# Patient Record
Sex: Male | Born: 1990 | Race: White | Hispanic: Yes | Marital: Married | Smoking: Never smoker
Health system: Southern US, Community
[De-identification: ages and names within clinical notes are randomized; demographics above are authoritative.]

---
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
## 2012-03-22 ENCOUNTER — Emergency Department (HOSPITAL_COMMUNITY): Payer: Self-pay

## 2012-03-22 ENCOUNTER — Encounter (HOSPITAL_COMMUNITY): Payer: Self-pay | Admitting: Emergency Medicine

## 2012-03-22 ENCOUNTER — Emergency Department (HOSPITAL_COMMUNITY)
Admission: EM | Admit: 2012-03-22 | Discharge: 2012-03-22 | Disposition: A | Discharge status: Home / Self Care | Payer: Self-pay | Attending: Emergency Medicine | Admitting: Emergency Medicine

## 2012-03-22 DIAGNOSIS — R0602 Shortness of breath: Secondary | ICD-10-CM | POA: Insufficient documentation

## 2012-03-22 DIAGNOSIS — R0789 Other chest pain: Secondary | ICD-10-CM

## 2012-03-22 DIAGNOSIS — R61 Generalized hyperhidrosis: Secondary | ICD-10-CM | POA: Insufficient documentation

## 2012-03-22 DIAGNOSIS — R6883 Chills (without fever): Secondary | ICD-10-CM | POA: Insufficient documentation

## 2012-03-22 DIAGNOSIS — R071 Chest pain on breathing: Secondary | ICD-10-CM | POA: Insufficient documentation

## 2012-03-22 DIAGNOSIS — R05 Cough: Secondary | ICD-10-CM | POA: Insufficient documentation

## 2012-03-22 DIAGNOSIS — R059 Cough, unspecified: Secondary | ICD-10-CM | POA: Insufficient documentation

## 2012-03-22 MED ORDER — IBUPROFEN 800 MG PO TABS
800.0000 mg | ORAL_TABLET | Freq: Once | ORAL | Status: AC
  Administered 2012-03-22: 800 mg via ORAL
  Filled 2012-03-22: qty 1

## 2012-03-22 MED ORDER — NAPROXEN 500 MG PO TABS: 500.0000 mg | ORAL_TABLET | Freq: Two times a day (BID) | ORAL | Status: DC

## 2012-03-22 NOTE — Discharge Instructions (Signed)
Take Naproxen twice a day. If you use the prescription, it is one pill twice a day. If you use the non-prescription medicine, it is two pills twice a day.  Costochondritis Costochondritis (Tietze syndrome), or costochondral separation, is a swelling and irritation (inflammation) of the tissue (cartilage) that connects your ribs with your breastbone (sternum). It may occur on its own (spontaneously), through damage caused by an accident (trauma), or simply from coughing or minor exercise. It may take up to 6 weeks to get better and longer if you are unable to be conservative in your activities. HOME CARE INSTRUCTIONS   Avoid exhausting physical activity. Try not to strain your ribs during normal activity. This would include any activities using chest, belly (abdominal), and side muscles, especially if heavy weights are used.  Use ice for 15 to 20 minutes per hour while awake for the first 2 days. Place the ice in a plastic bag, and place a towel between the bag of ice and your skin.  Only take over-the-counter or prescription medicines for pain, discomfort, or fever as directed by your caregiver. SEEK IMMEDIATE MEDICAL CARE IF:   Your pain increases or you are very uncomfortable.  You have a fever.  You develop difficulty with your breathing.  You cough up blood.  You develop worse chest pains, shortness of breath, sweating, or vomiting.  You develop new, unexplained problems (symptoms). MAKE SURE YOU:   Understand these instructions.  Will watch your condition.  Will get help right away if you are not doing well or get worse. Document Released: 01/11/2005 Document Revised: 06/26/2011 Document Reviewed: 11/20/2007 [facility] Patient Information 2013 [place], [place].  Naproxen and naproxen sodium oral immediate-release tablets What is this medicine? NAPROXEN (na PROX en) is a non-steroidal anti-inflammatory drug (NSAID). It is used to reduce swelling and to treat pain. This medicine  may be used for dental pain, headache, or painful monthly periods. It is also used for painful joint and muscular problems such as arthritis, tendinitis, bursitis, and gout. This medicine may be used for other purposes; ask your health care provider or pharmacist if you have questions. What should I tell my health care provider before I take this medicine? They need to know if you have any of these conditions: -asthma -cigarette smoker -drink more than 3 alcohol containing drinks a day -heart disease or circulation problems such as heart failure or leg edema (fluid retention) -high blood pressure -kidney disease -liver disease -stomach bleeding or ulcers -an unusual or allergic reaction to naproxen, aspirin, other NSAIDs, other medicines, foods, dyes, or preservatives -pregnant or trying to get pregnant -breast-feeding How should I use this medicine? Take this medicine by mouth with a glass of water. Follow the directions on the prescription label. Take it with food if your stomach gets upset. Try to not lie down for at least 10 minutes after you take it. Take your medicine at regular intervals. Do not take your medicine more often than directed. Long-term, continuous use may increase the risk of heart attack or stroke. A special MedGuide will be given to you by the pharmacist with each prescription and refill. Be sure to read this information carefully each time. Talk to your pediatrician regarding the use of this medicine in children. Special care may be needed. Overdosage: If you think you have taken too much of this medicine contact a poison control center or emergency room at once. NOTE: This medicine is only for you. Do not share this medicine with others. What  if I miss a dose? If you miss a dose, take it as soon as you can. If it is almost time for your next dose, take only that dose. Do not take double or extra doses. What may interact with this  medicine? -alcohol -aspirin -cidofovir -diuretics -lithium -methotrexate -other drugs for inflammation like ketorolac or prednisone -pemetrexed -probenecid -warfarin This list may not describe all possible interactions. Give your health care provider a list of all the medicines, herbs, non-prescription drugs, or dietary supplements you use. Also tell them if you smoke, drink alcohol, or use illegal drugs. Some items may interact with your medicine. What should I watch for while using this medicine? Tell your doctor or health care professional if your pain does not get better. Talk to your doctor before taking another medicine for pain. Do not treat yourself. This medicine does not prevent heart attack or stroke. In fact, this medicine may increase the chance of a heart attack or stroke. The chance may increase with longer use of this medicine and in people who have heart disease. If you take aspirin to prevent heart attack or stroke, talk with your doctor or health care professional. Do not take other medicines that contain aspirin, ibuprofen, or naproxen with this medicine. Side effects such as stomach upset, nausea, or ulcers may be more likely to occur. Many medicines available without a prescription should not be taken with this medicine. This medicine can cause ulcers and bleeding in the stomach and intestines at any time during treatment. Do not smoke cigarettes or drink alcohol. These increase irritation to your stomach and can make it more susceptible to damage from this medicine. Ulcers and bleeding can happen without warning symptoms and can cause death. You may get drowsy or dizzy. Do not drive, use machinery, or do anything that needs mental alertness until you know how this medicine affects you. Do not stand or sit up quickly, especially if you are an older patient. This reduces the risk of dizzy or fainting spells. This medicine can cause you to bleed more easily. Try to avoid damage  to your teeth and gums when you brush or floss your teeth. What side effects may I notice from receiving this medicine? Side effects that you should report to your doctor or health care professional as soon as possible: -black or bloody stools, blood in the urine or vomit -blurred vision -chest pain -difficulty breathing or wheezing -nausea or vomiting -severe stomach pain -skin rash, skin redness, blistering or peeling skin, hives, or itching -slurred speech or weakness on one side of the body -swelling of eyelids, throat, lips -unexplained weight gain or swelling -unusually weak or tired -yellowing of eyes or skin Side effects that usually do not require medical attention (report to your doctor or health care professional if they continue or are bothersome): -constipation -headache -heartburn This list may not describe all possible side effects. Call your doctor for medical advice about side effects. You may report side effects to FDA at 1-800-FDA-1088. Where should I keep my medicine? Keep out of the reach of children. Store at room temperature between 15 and 30 degrees C (59 and 86 degrees F). Keep container tightly closed. Throw away any unused medicine after the expiration date. NOTE: This sheet is a summary. It may not cover all possible information. If you have questions about this medicine, talk to your doctor, pharmacist, or health care provider.  2012, [employer]vier/Gold Standard. (04/05/2009 8:10:16 PM)

## 2012-03-22 NOTE — ED Notes (Signed)
Pt reports onset of chest pain 3 days ago. Started coughing last night. Pt denies nausea, vomiting or diaphoresis. Pt does state that he feels SOB. No family hx.

## 2012-03-22 NOTE — ED Provider Notes (Signed)
History  This chart was scribed for Dione Booze, MD by Shari Heritage, ED Scribe. The patient was seen in room APA15/APA15. Patient's care was started at 2147.  CSN: [id number]  Arrival date & time 03/22/12  2009   First MD Initiated Contact with Patient 03/22/12 2147      Chief Complaint  Patient presents with  . Chest Pain    The history is provided by the patient. No language interpreter was used.    HPI Comments: Jeremiah Hall is a 21 y.o. male who presents to the Emergency Department complaining of sharp, moderate, gradually worsening, bilateral, upper chest pain onset 3 days ago. Patient says that currently, pain is localized to the left side of his chest. Breathing and certain positions make the pain worse. There is associated SOB, nonproductive cough, chills and diaphoresis. Patient describes shortness of breath as feeling like he is not getting enough air. Patient says that he took some Tylenol for pain with mild relief.  Patient reports no significant past medical or surgical history. Patient does not smoke or drink. He does not have a PCP. He has a family history of diabetes.   Family History  Problem Relation Age of Onset  . Diabetes Father     History  Substance Use Topics  . Smoking status: Never Smoker   . Smokeless tobacco: Never Used  . Alcohol Use: No      Review of Systems  Constitutional: Positive for chills and diaphoresis.  Respiratory: Positive for cough and shortness of breath.   Cardiovascular: Positive for chest pain.  All other systems reviewed and are negative.    Allergies  Review of patient's allergies indicates no known allergies.  Home Medications  No current outpatient prescriptions on file.  Triage Vitals: BP 136/74  Pulse 70  Resp 16  Ht 6' (1.829 m)  Wt 255 lb (115.667 kg)  BMI 34.58 kg/m2  SpO2 99%  Physical Exam  Nursing note and vitals reviewed. Constitutional: He is oriented to person, place, and time. He appears  well-developed and well-nourished. No distress.  HENT:  Head: Normocephalic and atraumatic.  Eyes: Conjunctivae normal and EOM are normal. Pupils are equal, round, and reactive to light.  Neck: Neck supple.  Cardiovascular: Normal rate, regular rhythm and normal heart sounds.   No murmur heard. Pulmonary/Chest: Effort normal and breath sounds normal. No respiratory distress. He exhibits tenderness.       Mild anterior chest wall tenderness.  Abdominal: Soft. He exhibits no distension.  Musculoskeletal: Normal range of motion. He exhibits no edema.  Neurological: He is alert and oriented to person, place, and time.  Skin: Skin is warm and dry.  Psychiatric: He has a normal mood and affect. His behavior is normal. Thought content normal.    ED Course  Procedures (including critical care time) DIAGNOSTIC STUDIES: Oxygen Saturation is 99% on room air, normal by my interpretation.    COORDINATION OF CARE: 9:56 PM- Patient informed of current plan for treatment and evaluation and agrees with plan at this time. Medications ordered: 1 tablet ibuprofen 800 mg. Ordered chest x-ray.  Dg Chest 2 View  03/22/2012  *RADIOLOGY REPORT*  Clinical Data: Chest pain.  CHEST - 2 VIEW  Comparison: None.  Findings: Heart and mediastinal contours are within normal limits. No focal opacities or effusions.  No acute bony abnormality.  IMPRESSION: No active cardiopulmonary disease.   Original Report Authenticated By: Charlett Nose, M.D.    ECG shows normal sinus rhythm with a rate  of 66, no ectopy. Normal axis. Normal P wave. Normal QRS. Normal intervals. Normal ST and T waves. Impression: normal ECG. No prior ECG available for comparison.   1. Chest wall pain       MDM  Chest pain which seems to be chest wall pain. This may represent costochondritis. Chest x-ray will be obtained and will be given a therapeutic trial of ibuprofen.  He got good relief of pain with ibuprofen. Chest x-ray is unremarkable.  He'll be sent home with a prescription for naproxen.  I personally performed the services described in this documentation, which was scribed in my presence. The recorded information has been reviewed and is accurate.       Dione Booze, MD 03/22/12 [phone]

## 2013-12-19 IMAGING — CR DG CHEST 2V
2 series · 2 of 2 positions shown · non-contrast
Comparison: None.

CLINICAL DATA: Chest pain.

CHEST - 2 VIEW

[view not recorded (1 of 2)]
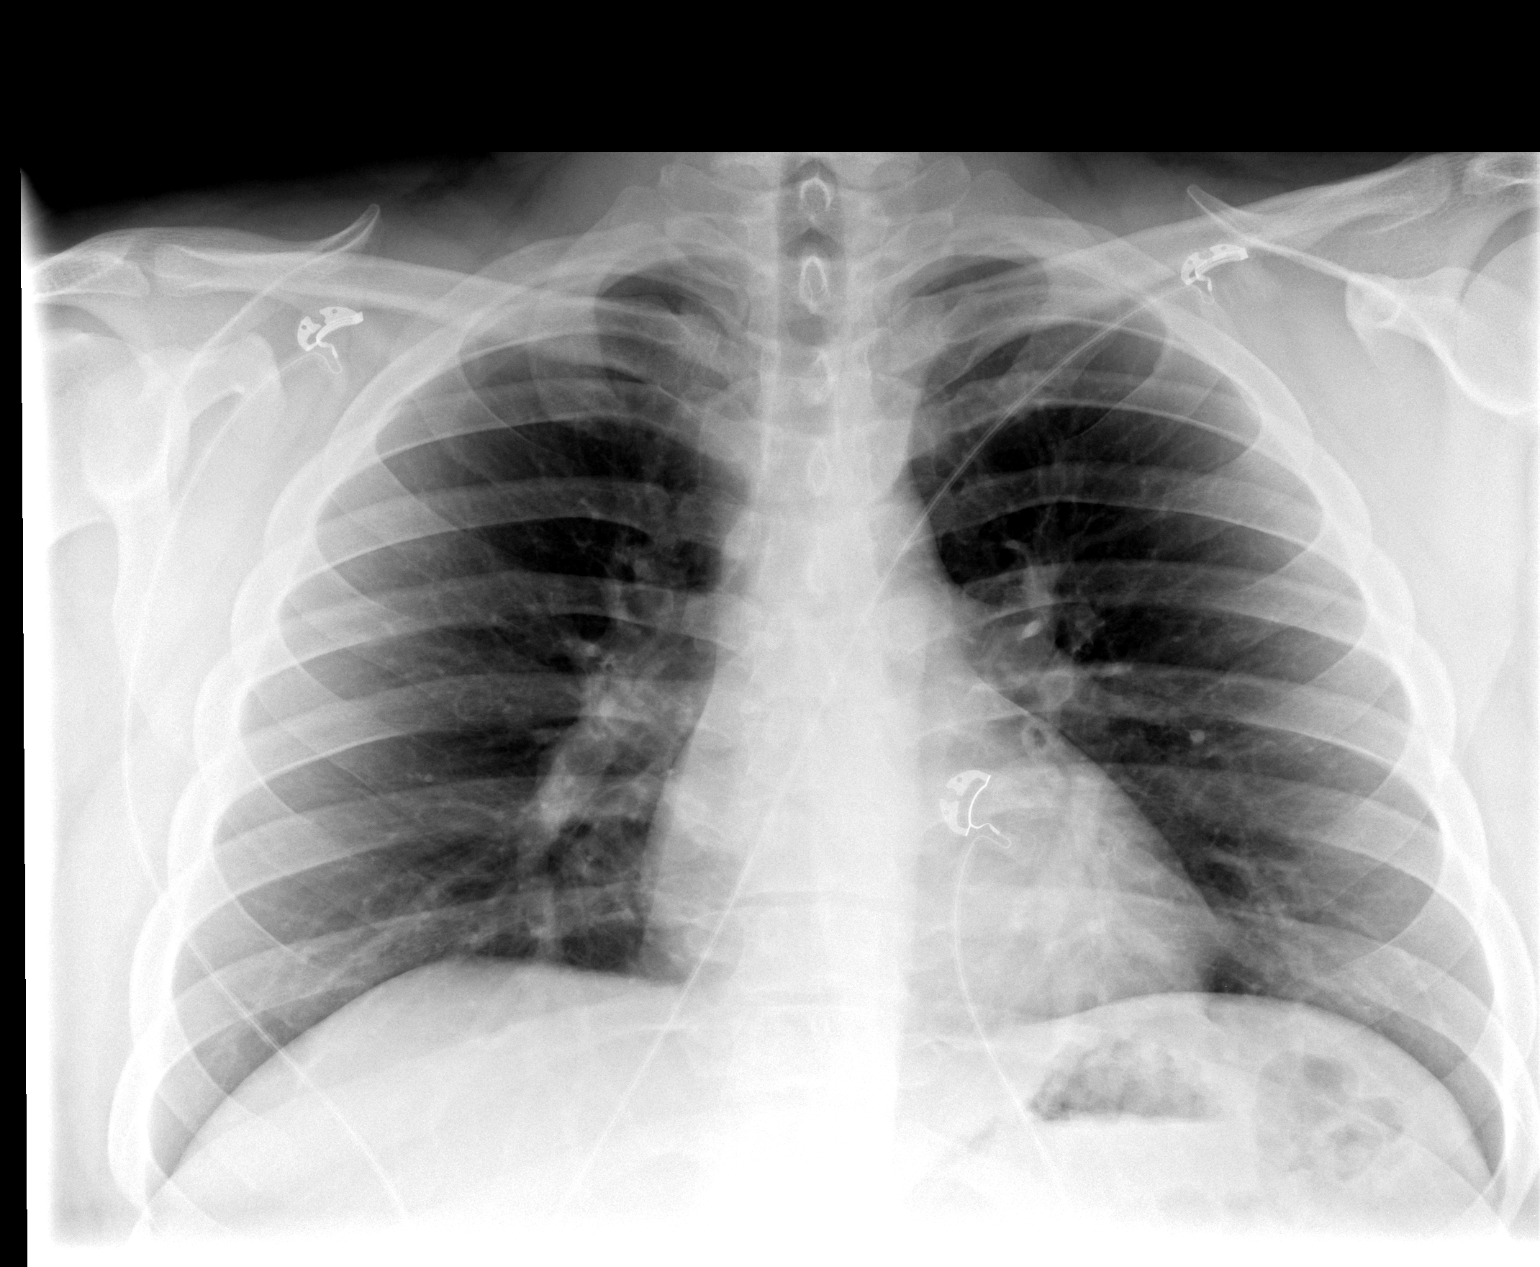

[view not recorded (2 of 2)]
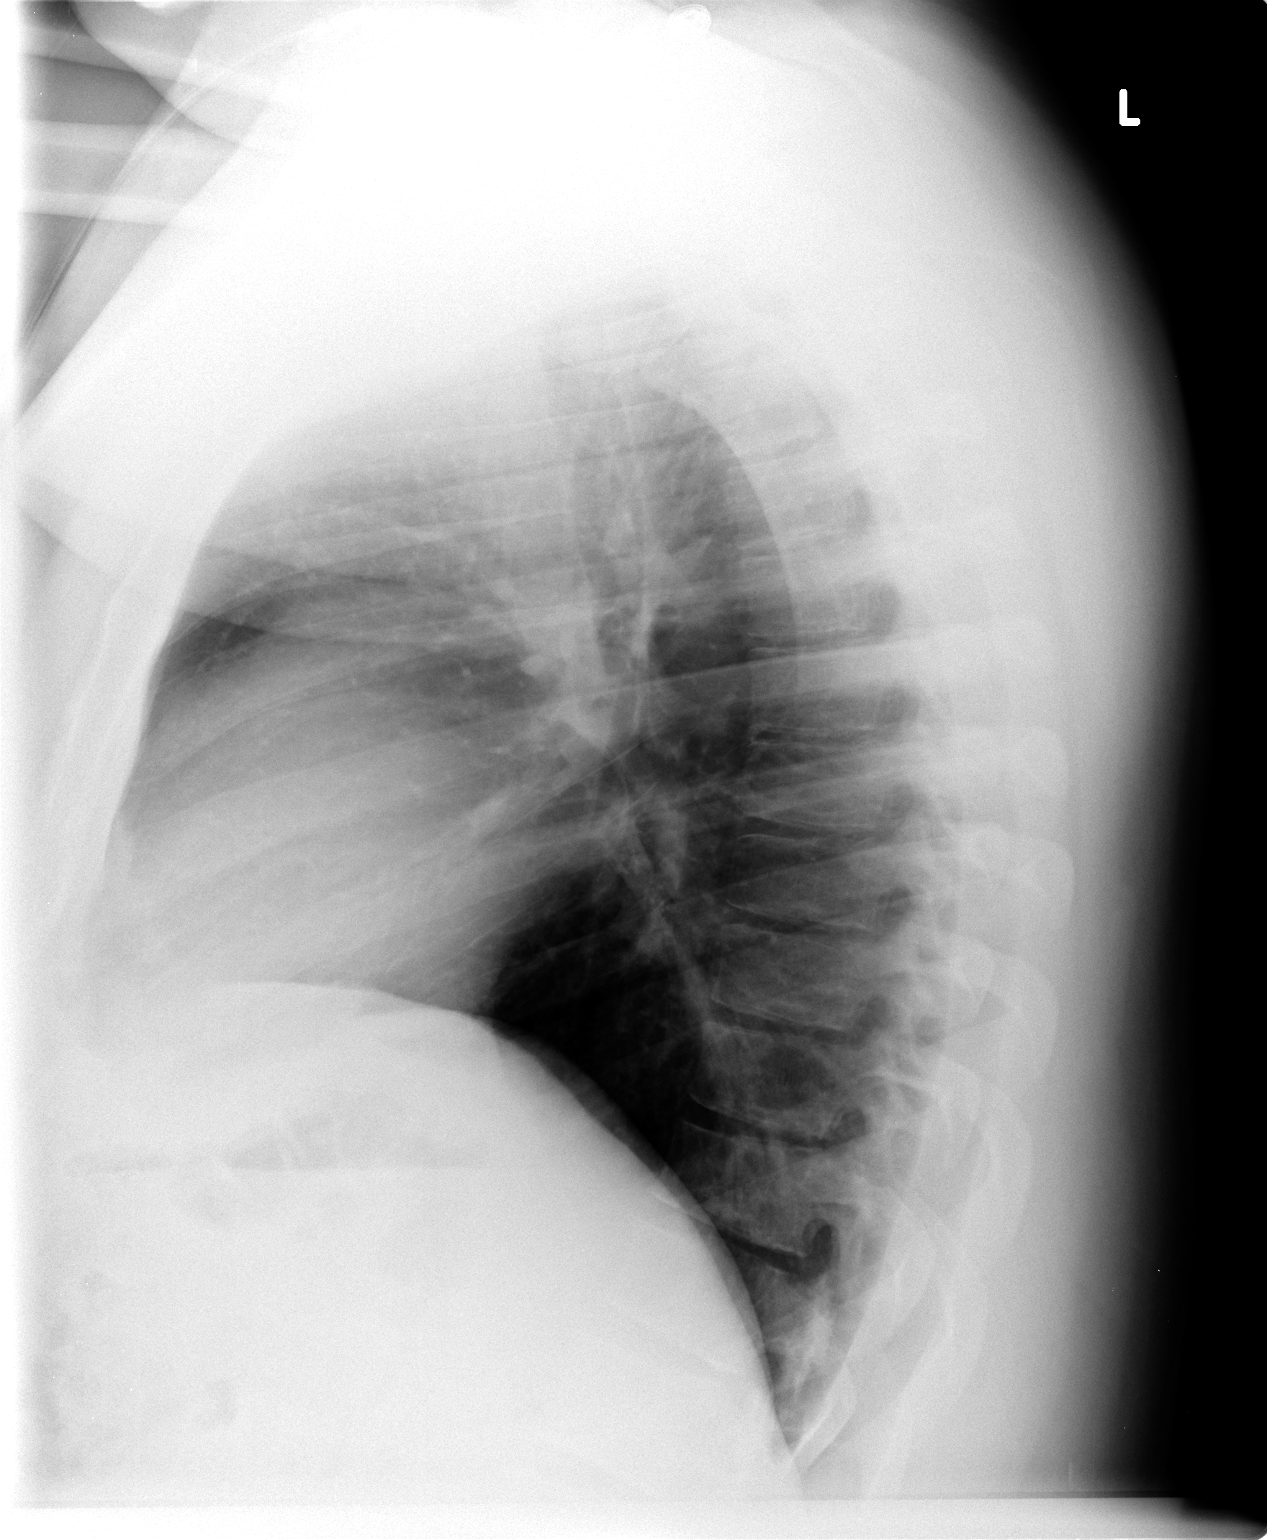

[2 of 2 positions shown; findings below may reference images not displayed]

FINDINGS: Heart and mediastinal contours are within normal limits.
No focal opacities or effusions.  No acute bony abnormality.
IMPRESSION: No active cardiopulmonary disease.

## 2017-03-24 ENCOUNTER — Encounter (HOSPITAL_COMMUNITY): Payer: Self-pay | Admitting: Emergency Medicine

## 2017-03-24 ENCOUNTER — Emergency Department (HOSPITAL_COMMUNITY): Payer: Self-pay

## 2017-03-24 ENCOUNTER — Emergency Department (HOSPITAL_COMMUNITY)
Admission: EM | Admit: 2017-03-24 | Discharge: 2017-03-24 | Disposition: A | Discharge status: Home / Self Care | Payer: Self-pay | Attending: Emergency Medicine | Admitting: Emergency Medicine

## 2017-03-24 DIAGNOSIS — R0789 Other chest pain: Secondary | ICD-10-CM | POA: Insufficient documentation

## 2017-03-24 LAB — URINALYSIS, ROUTINE W REFLEX MICROSCOPIC
Bilirubin Urine: NEGATIVE
Glucose, UA: NEGATIVE mg/dL
Hgb urine dipstick: NEGATIVE
Ketones, ur: NEGATIVE mg/dL
Leukocytes, UA: NEGATIVE
Nitrite: NEGATIVE
Protein, ur: NEGATIVE mg/dL
Specific Gravity, Urine: 1.023 (ref 1.005–1.030)
pH: 5 (ref 5.0–8.0)

## 2017-03-24 LAB — CBG MONITORING, ED: Glucose-Capillary: 89 mg/dL (ref 65–99)

## 2017-03-24 MED ORDER — PANTOPRAZOLE SODIUM 20 MG PO TBEC: 20.0000 mg | DELAYED_RELEASE_TABLET | Freq: Two times a day (BID) | ORAL | 0 refills | Status: AC

## 2017-03-24 MED ORDER — GI COCKTAIL ~~LOC~~
30.0000 mL | Freq: Once | ORAL | Status: AC
  Administered 2017-03-24: 30 mL via ORAL
  Filled 2017-03-24: qty 30

## 2017-03-24 MED ORDER — FAMOTIDINE 20 MG PO TABS
20.0000 mg | ORAL_TABLET | Freq: Once | ORAL | Status: AC
  Administered 2017-03-24: 20 mg via ORAL
  Filled 2017-03-24: qty 1

## 2017-03-24 NOTE — ED Triage Notes (Addendum)
Pt C/O of difficulty swallowing and states "it feels like something is stuck in my throat." Pt also C/O Sob and increased thirst. NAD. Pt states this has been going on since Tuesday.

## 2017-03-24 NOTE — ED Notes (Signed)
Gave EKG to Dr.Kohut 

## 2017-03-25 NOTE — ED Provider Notes (Signed)
[facility]ERGENCY DEPARTMENT Provider Note   CSN: [id number] Arrival date & time: 03/24/17  1927     History   Chief Complaint Chief Complaint  Patient presents with  . Dysphagia    HPI Jeremiah Hall is a 26 y.o. male.  HPI   26yM with chest tightness. Says not really pain. Feels like their is something there. Sometimes feels like things get stuck when he swallowed solid. No n/v. No respiratory complaints. No orthopnea. Occasional nonproductive cough. No unusual leg pain or swelling.   History reviewed. No pertinent past medical history.  There are no active problems to display for this patient.   History reviewed. No pertinent surgical history.     Home Medications    Prior to Admission medications   Medication Sig Start Date End Date Taking? Authorizing Provider  naproxen sodium (ALEVE) 220 MG tablet Take 220 mg by mouth daily as needed (for pain).   Yes [provider]  pantoprazole (PROTONIX) 20 MG tablet Take 1 tablet (20 mg total) by mouth 2 (two) times daily before a meal. 03/24/17   Raeford RazorKohut, Lavinia Mcneely, MD    Family History Family History  Problem Relation Age of Onset  . Diabetes Father     Social History Social History   Tobacco Use  . Smoking status: Never Smoker  . Smokeless tobacco: Never Used  Substance Use Topics  . Alcohol use: No  . Drug use: No     Allergies   Patient has no known allergies.   Review of Systems Review of Systems  All systems reviewed and negative, other than as noted in HPI.  Physical Exam Updated Vital Signs BP 116/78   Pulse (!) 56   Temp 98.4 F (36.9 C) (Oral)   Resp (!) 22   Ht 6' (1.829 m)   Wt 133.7 kg (294 lb 12.8 oz)   SpO2 98%   BMI 39.98 kg/m   Physical Exam  Constitutional: He appears well-developed and well-nourished. No distress.  Sitting in bed. NAD. Obese.   HENT:  Head: Normocephalic and atraumatic.  Eyes: Conjunctivae are normal. Right eye exhibits no discharge. Left  eye exhibits no discharge.  Neck: Neck supple.  Cardiovascular: Normal rate, regular rhythm and normal heart sounds. Exam reveals no gallop and no friction rub.  No murmur heard. Pulmonary/Chest: Effort normal and breath sounds normal. No respiratory distress.  Abdominal: Soft. He exhibits no distension. There is no tenderness.  Musculoskeletal: He exhibits no edema or tenderness.  Lower extremities symmetric as compared to each other. No calf tenderness. Negative Homan's. No palpable cords.   Neurological: He is alert.  Skin: Skin is warm and dry.  Psychiatric: He has a normal mood and affect. His behavior is normal. Thought content normal.  Nursing note and vitals reviewed.    ED Treatments / Results  Labs (all labs ordered are listed, but only abnormal results are displayed) Labs Reviewed  URINALYSIS, ROUTINE W REFLEX MICROSCOPIC  CBG MONITORING, ED    EKG  EKG Interpretation None       Radiology Dg Chest 2 View  Result Date: 03/24/2017 CLINICAL DATA:  Chest tightness.  Shortness of breath. EXAM: CHEST  2 VIEW COMPARISON:  March 22, 2012 FINDINGS: The heart size and mediastinal contours are within normal limits. Both lungs are clear. The visualized skeletal structures are unremarkable. IMPRESSION: No active cardiopulmonary disease. Electronically Signed   By: Gerome Samavid  Williams III M.D   On: 03/24/2017 22:38    Procedures Procedures (including  critical care time)  Medications Ordered in ED Medications  gi cocktail (Maalox,Lidocaine,Donnatal) (30 mLs Oral Given 03/24/17 2224)  famotidine (PEPCID) tablet 20 mg (20 mg Oral Given 03/24/17 2224)     Initial Impression / Assessment and Plan / ED Course  I have reviewed the triage vital signs and the nursing notes.  Pertinent labs & imaging results that were available during my care of the patient were reviewed by me and considered in my medical decision making (see chart for details).     26yM with chest tightness.  Suspect may be gerd. Doubt acs, pe, dissection or other emergent process. Trial of ppi.   Final Clinical Impressions(s) / ED Diagnoses   Final diagnoses:  Chest tightness    ED Discharge Orders        Ordered    pantoprazole (PROTONIX) 20 MG tablet  2 times daily before meals     03/24/17 2259       Raeford RazorKohut, Kindred Heying, MD 03/25/17 1920

## 2018-12-21 IMAGING — DX DG CHEST 2V
2 series · 2 of 2 positions shown · non-contrast
Comparison: March 22, 2012

CLINICAL DATA: Chest tightness.  Shortness of breath.

EXAM:
CHEST  2 VIEW

[chest pa]
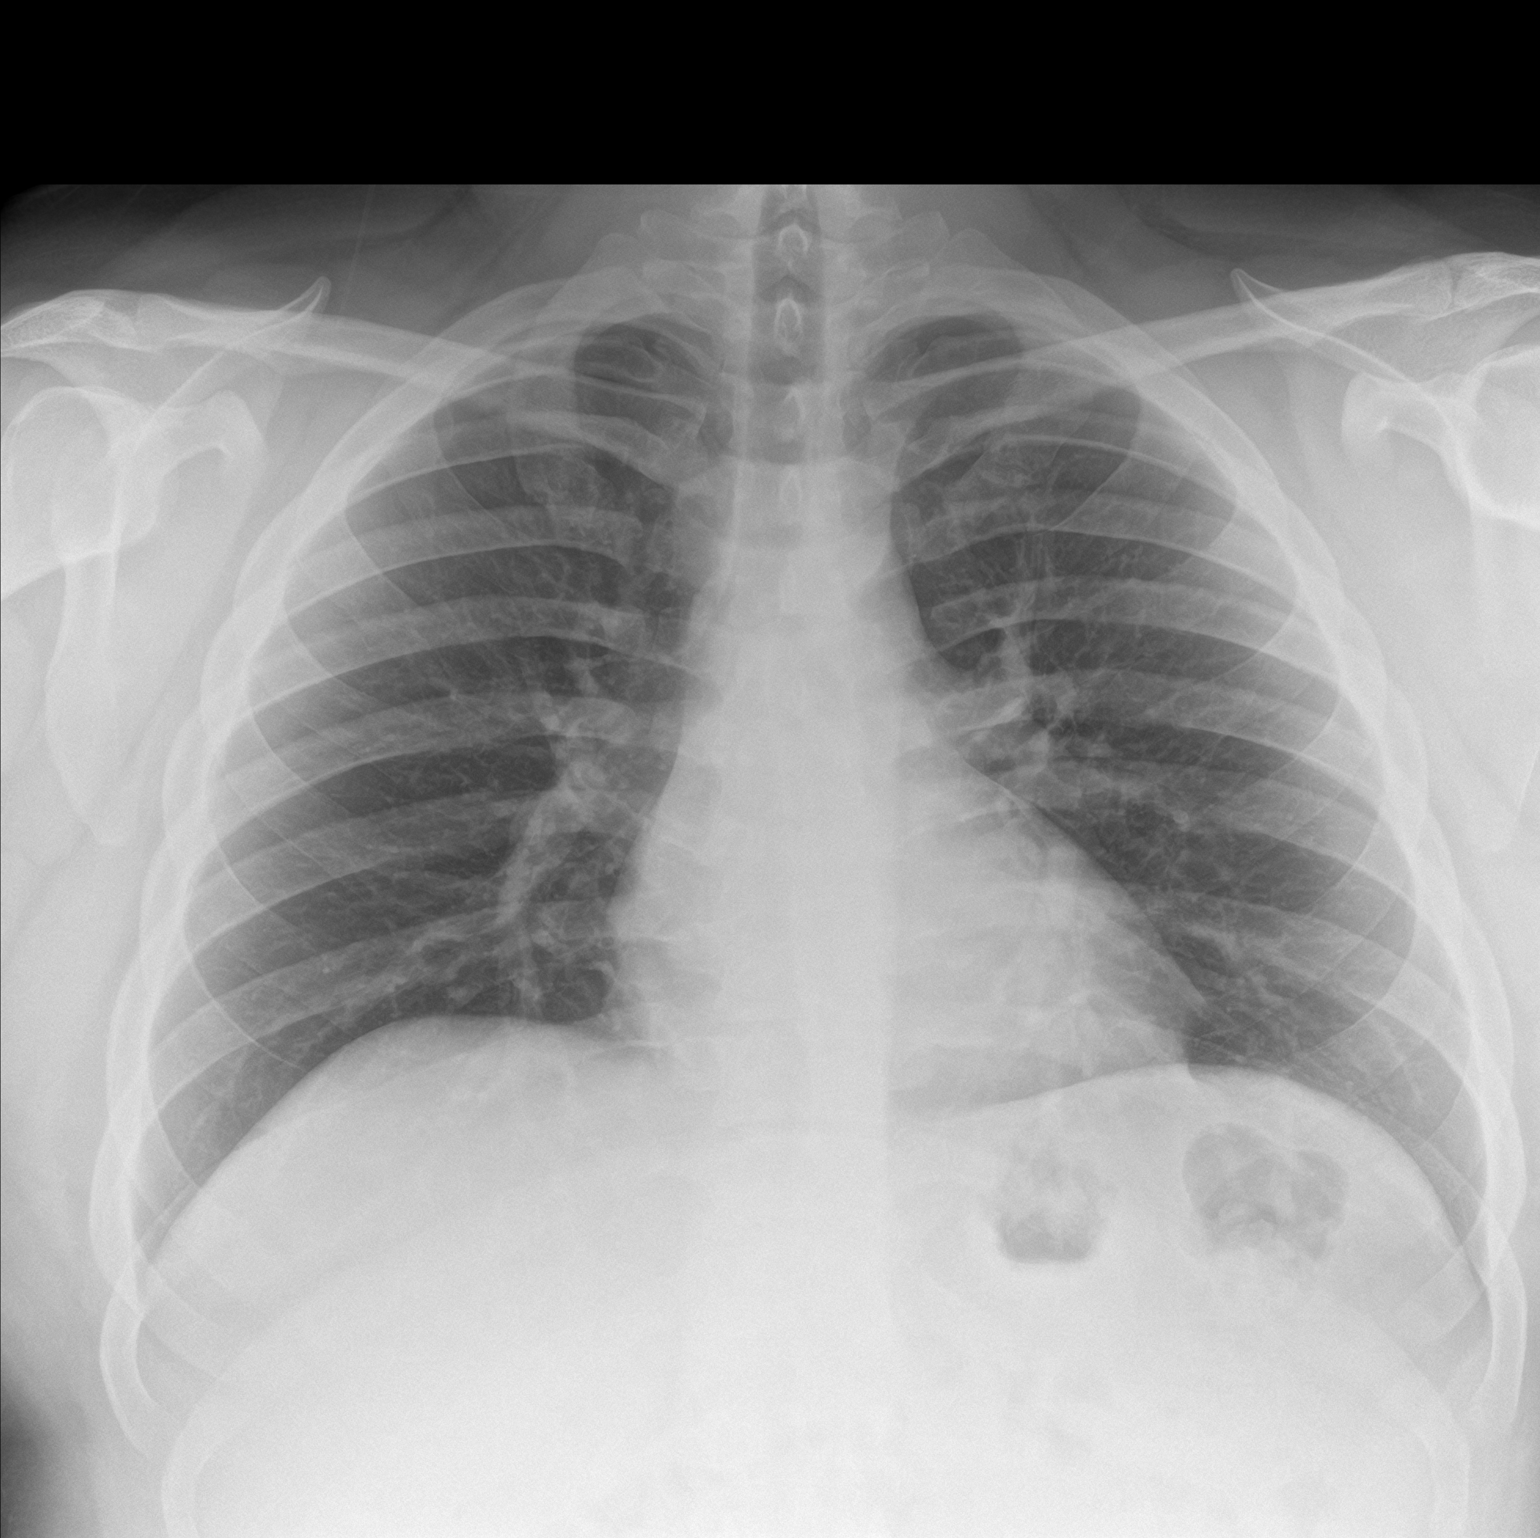

[chest lat]
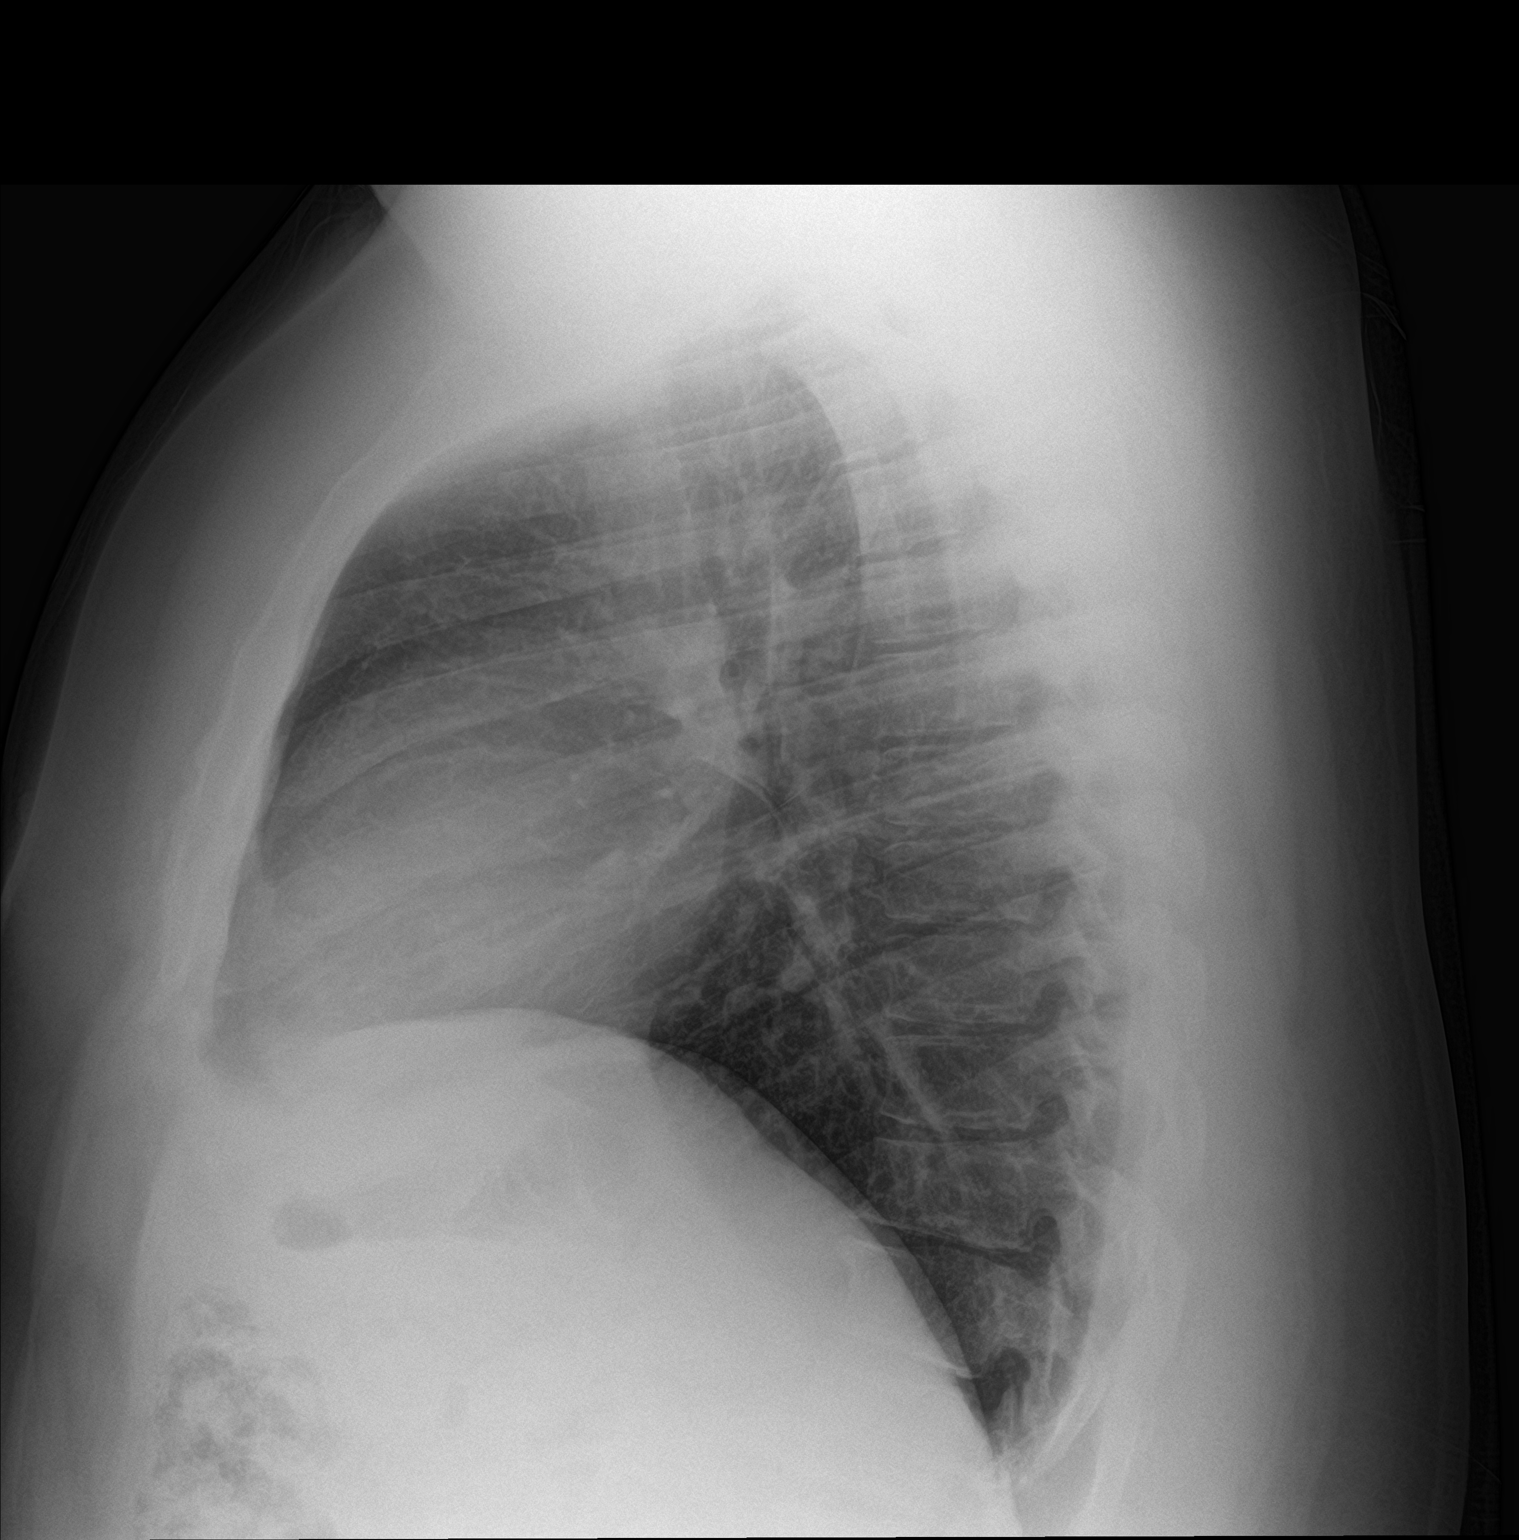

[2 of 2 positions shown; findings below may reference images not displayed]

FINDINGS: The heart size and mediastinal contours are within normal limits.
Both lungs are clear. The visualized skeletal structures are
unremarkable.
IMPRESSION: No active cardiopulmonary disease.
